# Patient Record
Sex: Male | Born: 1970 | Race: White | Hispanic: No | Marital: Married | Smoking: Current every day smoker
Health system: Southern US, Community
[De-identification: ages and names within clinical notes are randomized; demographics above are authoritative.]

## PROBLEM LIST (undated history)

## (undated) ENCOUNTER — Emergency Department: Admission: EM | Payer: Self-pay | Source: Home / Self Care

## (undated) DIAGNOSIS — N028 Recurrent and persistent hematuria with other morphologic changes: Secondary | ICD-10-CM

## (undated) HISTORY — PX: BACK SURGERY: SHX140

---
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
## 1998-11-19 DIAGNOSIS — N028 Recurrent and persistent hematuria with other morphologic changes: Secondary | ICD-10-CM

## 1998-11-19 HISTORY — DX: Recurrent and persistent hematuria with other morphologic changes: N02.8

## 2003-07-08 ENCOUNTER — Emergency Department (HOSPITAL_COMMUNITY): Admission: EM | Admit: 2003-07-08 | Discharge: 2003-07-08 | Payer: Self-pay | Admitting: Emergency Medicine

## 2013-02-06 ENCOUNTER — Other Ambulatory Visit: Payer: Self-pay | Admitting: Radiology

## 2019-03-16 ENCOUNTER — Emergency Department (HOSPITAL_COMMUNITY): Payer: Worker's Compensation

## 2019-03-16 ENCOUNTER — Emergency Department (HOSPITAL_COMMUNITY)
Admission: EM | Admit: 2019-03-16 | Discharge: 2019-03-16 | Disposition: A | Discharge status: Home / Self Care | Payer: Self-pay | Attending: Emergency Medicine | Admitting: Emergency Medicine

## 2019-03-16 ENCOUNTER — Encounter (HOSPITAL_COMMUNITY): Payer: Self-pay | Admitting: Emergency Medicine

## 2019-03-16 ENCOUNTER — Other Ambulatory Visit: Payer: Self-pay

## 2019-03-16 DIAGNOSIS — I10 Essential (primary) hypertension: Secondary | ICD-10-CM

## 2019-03-16 DIAGNOSIS — X503XXA Overexertion from repetitive movements, initial encounter: Secondary | ICD-10-CM | POA: Insufficient documentation

## 2019-03-16 DIAGNOSIS — Y990 Civilian activity done for income or pay: Secondary | ICD-10-CM | POA: Insufficient documentation

## 2019-03-16 DIAGNOSIS — F17210 Nicotine dependence, cigarettes, uncomplicated: Secondary | ICD-10-CM | POA: Insufficient documentation

## 2019-03-16 DIAGNOSIS — Y9263 Factory as the place of occurrence of the external cause: Secondary | ICD-10-CM | POA: Insufficient documentation

## 2019-03-16 DIAGNOSIS — S63502A Unspecified sprain of left wrist, initial encounter: Secondary | ICD-10-CM | POA: Insufficient documentation

## 2019-03-16 DIAGNOSIS — Y9389 Activity, other specified: Secondary | ICD-10-CM | POA: Insufficient documentation

## 2019-03-16 DIAGNOSIS — R030 Elevated blood-pressure reading, without diagnosis of hypertension: Secondary | ICD-10-CM | POA: Insufficient documentation

## 2019-03-16 NOTE — ED Notes (Signed)
Bed: WA03 Expected date:  Expected time:  Means of arrival:  Comments: 

## 2019-03-16 NOTE — ED Provider Notes (Signed)
[facility]   CSN: [id number] Arrival date & time: 03/16/19  1042    History   Chief Complaint Chief Complaint  Patient presents with  . Wrist Pain    left    HPI Tyrone Wood is a 48 y.o. 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  Onset on Friday.  Sudden.  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  He was using a box cutter to open up a box and had sudden, pulling pain in the ventral part of his left wrist.  A few minutes later he noticed some local bruising and swelling.  Since he has had pulling type pain in this area with any movement of the wrist and fingers.  No interventions for this.  Alleviated with rest.  He is right-hand dominant.  He denies associated distal paresthesias or loss of sensation.  No fevers, redness to the area, warmth.  His boss told him he should come to the ED to get a work note so she can put him on light duty.    Additionally, patient states he just got out of prison and is worried about his high blood pressure reading today in the ED and his kidney disease.  When he got out of prison he went to [facility] nephrology and they prescribed him lisinopril which he has been taking.  He smoked a few cigarettes en route to ED. He denies any severe headache, vision changes, chest pain, shortness of breath, back pain, lower extremity edema. Has not found a PCP since getting out of prison.    HPI  History reviewed. No pertinent past medical history.  There are no active problems to display for this patient.   History reviewed. No pertinent surgical history.      Home Medications    Prior to Admission medications   Not on File    Family History No family history on file.  Social History Social History   Tobacco Use  . Smoking status: Never Smoker  . Smokeless tobacco: Never Used  Substance Use Topics  . Alcohol use: Not on file  . Drug use: Not on file      Allergies   Patient has no known allergies.   Review of Systems Review of Systems  Musculoskeletal: Positive for arthralgias and joint swelling.  All other systems reviewed and are negative.    Physical Exam Updated Vital Signs BP (!) 144/66   Pulse 82   Temp 97.7 F (36.5 C) (Oral)   Resp 18   SpO2 100%   Physical Exam Constitutional:      Appearance: He is well-developed. He is not toxic-appearing.  HENT:     Head: Normocephalic.     Right Ear: External ear normal.     Left Ear: External ear normal.     Nose: Nose normal.  Eyes:     Conjunctiva/sclera: Conjunctivae normal.  Neck:     Musculoskeletal: Full passive range of motion without pain.  Cardiovascular:     Rate and Rhythm: Normal rate and regular rhythm.     Comments: Left upper extremity: 1+ radial pulse.  Brisk cap refill distally.  Fingers are warm. Pulmonary:     Effort: Pulmonary effort is normal. No tachypnea or respiratory distress.     Breath sounds: Normal breath sounds.  Musculoskeletal: Normal range of motion.        General: Swelling and tenderness present.  Comments: Left wrist: Mild focal tenderness and edema to the ventral wrist and distal forearm.  Full active range of motion of the wrist and fingers with some pain reported on the ventral wrist.  No crepitus.  No obvious deformity.  No focal tenderness to the scaphoid.  No overlying joint erythema, warmth, fluctuance.  Skin:    General: Skin is warm and dry.     Capillary Refill: Capillary refill takes less than 2 seconds.  Neurological:     Mental Status: He is alert and oriented to person, place, and time.     Comments: Sensation to light touch in the median, ulnar, radial nerve distribution intact in the left hand.  5/5 strength with handgrip and wrist pronation/supination, flexion and extension.  Psychiatric:        Behavior: Behavior normal.        Thought Content: Thought content normal.      ED Treatments / Results  Labs (all  labs ordered are listed, but only abnormal results are displayed) Labs Reviewed - No data to display  EKG None  Radiology Dg Wrist Complete Left  Result Date: 03/16/2019 CLINICAL DATA:  Injury to the LEFT wrist while cutting boxes at work 3 days ago, now with bruising and swelling. Audible pop at the time of the injury. Initial encounter. EXAM: LEFT WRIST - COMPLETE 3+ VIEW COMPARISON:  None. FINDINGS: Mild soft tissue swelling and moderate-sized joint effusion. No evidence of acute fracture; accessory ossicles for the ulnar styloid, the radial styloid and an os radiale externum (adjacent to the distal scaphoid) are present. Well-preserved joint spaces. Well-preserved bone mineral density. IMPRESSION: No acute osseous abnormality. Accessory ossicles for the ulnar styloid, the radial styloid and the os radiale externum are present. Electronically Signed   By: Hulan Saashomas  Lawrence M.D.   On: 03/16/2019 13:07    Procedures Procedures (including critical care time)  Medications Ordered in ED Medications - No data to display   Initial Impression / Assessment and Plan / ED Course  I have reviewed the triage vital signs and the nursing notes.  Pertinent labs & imaging results that were available during my care of the patient were reviewed by me and considered in my medical decision making (see chart for details).       Moderate wrist effusion on exam but no acute unstable fractures.  Extremity is NVI. Joint has almost full ROM limited by pain.  No signs of overlaying infection, septic arthritis. Doubt gout in this setting. Suspect soft tissue injury strain vs carpal tunnel syndrome.  Wrist placed in velcro splint. Dc with 1g tylenol q8, RICE for 1 week. F/u with PCP for re-evaluation. Work note given to modify work activities.   SBP normal, DBP elevated in setting of known HTN.  Smoked cigarettes PTA which could be contributing.  No clinical features to suggest end organ dysfunction such as HA,  vision changes, CP, SOB, orthopnea, LE edema.  Asymptomatic hypertension, no further indication for aggressive BP control or further emergent work up.  Will dc with f/u with PCP within one week for recheck of BP, may need med adjustment. Return precautions discussed. He is in agreement with this.  Final Clinical Impressions(s) / ED Diagnoses   Final diagnoses:  Sprain of left wrist, initial encounter  Elevated blood pressure reading in office with diagnosis of hypertension    ED Discharge Orders    None       Liberty HandyGibbons, Lourene Hoston J, PA-C 03/16/19 2252    [place],  Riley Lam, MD 03/19/19 [phone]

## 2019-03-16 NOTE — ED Triage Notes (Signed)
Pt reports on Friday he was at work cutting boxes when something popped in left wrist. Had bruising and swelling. No bruising noted now. Was instructed to boss to go to ED to be sen and get note for work restrictions since he is having pains.

## 2019-03-16 NOTE — Discharge Instructions (Addendum)
You were seen in the ED for wrist pain. X-ray showed an effusion (fluid). This may be from soft tissue injury or tendon/ligament injury.  Wear brace for 10 days.  Modify activities at work.  Take 1000 mg acetaminophen (tylenol) every 8 hours for the next 7 days. Rest. Ice. Elevate.  Return for more swelling, redness, warmth, fever, loss of sensation of finger tips  Follow up with primary care doctor if pain persists. Follow up with primary care doctor about elevated blood pressure. Continue taking your blood pressure medicines. Return for severe headaches, vision changes, chest pain, shortness of breath, lower extremity swelling, stroke symptoms

## 2019-04-30 ENCOUNTER — Emergency Department (HOSPITAL_COMMUNITY)
Admission: EM | Admit: 2019-04-30 | Discharge: 2019-05-01 | Disposition: A | Discharge status: Home / Self Care | Payer: Self-pay | Attending: Emergency Medicine | Admitting: Emergency Medicine

## 2019-04-30 ENCOUNTER — Encounter (HOSPITAL_COMMUNITY): Payer: Self-pay | Admitting: Emergency Medicine

## 2019-04-30 ENCOUNTER — Other Ambulatory Visit: Payer: Self-pay

## 2019-04-30 DIAGNOSIS — F1920 Other psychoactive substance dependence, uncomplicated: Secondary | ICD-10-CM | POA: Insufficient documentation

## 2019-04-30 DIAGNOSIS — R456 Violent behavior: Secondary | ICD-10-CM | POA: Insufficient documentation

## 2019-04-30 NOTE — ED Notes (Signed)
Bed: FV43 Expected date:  Expected time:  Means of arrival:  Comments: EMS 48 yo male wants rehab for heroin use 120/70 SR HR 90
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
## 2019-05-01 NOTE — ED Notes (Signed)
Patient ambulating around room, talking on the phone. Patient given PO fluids and a sandwich.

## 2019-05-01 NOTE — ED Provider Notes (Signed)
[facility] Provider Note   CSN: [id number] Arrival date & time: 04/30/19  2317    History   Chief Complaint Chief Complaint  Patient presents with  . heroin use    HPI Christia ReadingJimmy Sane is a 48 y.o. male.     Patient brought to the emergency department by EMS.  He apparently snorted heroin earlier tonight and drank alcohol.  EMS reports that he called them stating that he needed detox from heroin.  EMS report that he had been sleepy but arousable throughout the transport but upon putting him into the room in the ER he became agitated and combative. Level V Caveat due to mental status change.     History reviewed. No pertinent past medical history.  There are no active problems to display for this patient.   History reviewed. No pertinent surgical history.      Home Medications    Prior to Admission medications   Not on File    Family History No family history on file.  Social History Social History   Tobacco Use  . Smoking status: Never Smoker  . Smokeless tobacco: Never Used  Substance Use Topics  . Alcohol use: Not on file  . Drug use: Not on file     Allergies   Patient has no known allergies.   Review of Systems Review of Systems  Unable to perform ROS: Mental status change     Physical Exam Updated Vital Signs BP 120/70   Pulse 90   Temp 98.2 F (36.8 C) (Oral)   Resp 14   SpO2 95%   Physical Exam Vitals signs and nursing note reviewed.  Constitutional:      General: He is not in acute distress.    Appearance: Normal appearance. He is well-developed.  HENT:     Head: Normocephalic and atraumatic.     Right Ear: Hearing normal.     Left Ear: Hearing normal.     Nose: Nose normal.  Eyes:     Conjunctiva/sclera: Conjunctivae normal.     Pupils: Pupils are equal, round, and reactive to light.  Neck:     Musculoskeletal: Normal range of motion and neck supple.  Cardiovascular:     Rate and  Rhythm: Regular rhythm.     Heart sounds: S1 normal and S2 normal. No murmur. No friction rub. No gallop.   Pulmonary:     Effort: Pulmonary effort is normal. No respiratory distress.     Breath sounds: Normal breath sounds.  Chest:     Chest wall: No tenderness.  Abdominal:     General: Bowel sounds are normal.     Palpations: Abdomen is soft.     Tenderness: There is no abdominal tenderness. There is no guarding or rebound. Negative signs include Murphy's sign and McBurney's sign.     Hernia: No hernia is present.  Musculoskeletal: Normal range of motion.  Skin:    General: Skin is warm and dry.     Findings: No rash.  Neurological:     GCS: GCS eye subscore is 4. GCS verbal subscore is 4. GCS motor subscore is 6.     Cranial Nerves: No cranial nerve deficit.     Sensory: No sensory deficit.     Coordination: Coordination normal.     Comments: Somnolent but easily arousable  Psychiatric:        Speech: Speech is slurred.        Behavior: Behavior is agitated.  ED Treatments / Results  Labs (all labs ordered are listed, but only abnormal results are displayed) Labs Reviewed - No data to display  EKG None  Radiology No results found.  Procedures Procedures (including critical care time)  Medications Ordered in ED Medications - No data to display   Initial Impression / Assessment and Plan / ED Course  I have reviewed the triage vital signs and the nursing notes.  Pertinent labs & imaging results that were available during my care of the patient were reviewed by me and considered in my medical decision making (see chart for details).        Patient brought to the emergency department with altered mental status after drinking alcohol and using heroin.  Patient was monitored here in the ER for a period of time he became more awake and alert.  At some point he eloped from the ER, attempts to find him on the grounds were unsuccessful.  Final Clinical  Impressions(s) / ED Diagnoses   Final diagnoses:  Polysubstance dependence including opioid type drug without complication, episodic abuse [facility])    ED Discharge Orders    None       Orpah Greek, MD 05/02/19 [phone]

## 2019-05-01 NOTE — ED Notes (Signed)
Patient wanted to leave after speaking with his sister on the phone. Patient started walking out of the facility. He was attempting to smoke in his room and was told he was not allowed to, he had to leave the property. Security escorted patient off the property.

## 2019-05-01 NOTE — Progress Notes (Signed)
CSW received phone call from patient who was located in the ED lobby. Patient reports he needs assistance with getting to [facility] in [place] (patient was unsure of the facility initially, so CSW had to call and confirm). Patient reports he was told last night that detox is not done here and he needs help as he OD on heroin and drank alcohol last night. Of note, patient left hospital AMA after wanting to smoke a cigarette and attempting to smoke in his room leading him to be escorted out of the hospital by security. CSW consulted with [facility] assistance director Nathaniel Man as well as [facility] Nurse for assistance on providing transportation for patient. It was determined that because patient left AMA, CSW will not be able to provide patient with a taxi voucher due to him not being medically cleared. CSW explained this to patient with a Animal nutritionist present. Patient reports that he will try to find his own transportation.   [place]en Circle, LCSW Transitions of Care Department [facility] ED [phone]

## 2019-05-06 ENCOUNTER — Other Ambulatory Visit: Payer: Self-pay

## 2019-05-06 ENCOUNTER — Emergency Department (HOSPITAL_COMMUNITY)
Admission: EM | Admit: 2019-05-06 | Discharge: 2019-05-06 | Disposition: A | Discharge status: Home / Self Care | Payer: Self-pay | Attending: Emergency Medicine | Admitting: Emergency Medicine

## 2019-05-06 ENCOUNTER — Encounter (HOSPITAL_COMMUNITY): Payer: Self-pay | Admitting: Emergency Medicine

## 2019-05-06 ENCOUNTER — Emergency Department (HOSPITAL_COMMUNITY): Payer: Self-pay

## 2019-05-06 DIAGNOSIS — Y939 Activity, unspecified: Secondary | ICD-10-CM | POA: Insufficient documentation

## 2019-05-06 DIAGNOSIS — Y999 Unspecified external cause status: Secondary | ICD-10-CM | POA: Insufficient documentation

## 2019-05-06 DIAGNOSIS — Y929 Unspecified place or not applicable: Secondary | ICD-10-CM | POA: Insufficient documentation

## 2019-05-06 DIAGNOSIS — F17210 Nicotine dependence, cigarettes, uncomplicated: Secondary | ICD-10-CM | POA: Insufficient documentation

## 2019-05-06 DIAGNOSIS — R42 Dizziness and giddiness: Secondary | ICD-10-CM | POA: Insufficient documentation

## 2019-05-06 DIAGNOSIS — F1990 Other psychoactive substance use, unspecified, uncomplicated: Secondary | ICD-10-CM | POA: Insufficient documentation

## 2019-05-06 DIAGNOSIS — S0083XA Contusion of other part of head, initial encounter: Secondary | ICD-10-CM

## 2019-05-06 DIAGNOSIS — S0003XA Contusion of scalp, initial encounter: Secondary | ICD-10-CM | POA: Insufficient documentation

## 2019-05-06 MED ORDER — ACETAMINOPHEN 500 MG PO TABS
1000.0000 mg | ORAL_TABLET | Freq: Once | ORAL | Status: DC
  Filled 2019-05-06: qty 2
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
## 2019-05-06 NOTE — ED Notes (Signed)
Pt discharged from ED; instructions provided  given; Pt encouraged to return to ED if symptoms worsen and to f/u with PCP; Pt verbalized understanding of all instructions 

## 2019-05-06 NOTE — Discharge Instructions (Signed)
Discontinue use of illicit drugs.  We recommend Tylenol or ibuprofen for management of any residual discomfort.  Apply ice to areas of pain 3-4 times per day to limit inflammation/swelling.  Follow-up with a primary care doctor to ensure that symptoms resolve.  You may return to the ED for new or concerning symptoms.

## 2019-05-29 ENCOUNTER — Ambulatory Visit: Payer: Self-pay | Admitting: Internal Medicine

## 2019-06-12 ENCOUNTER — Ambulatory Visit: Payer: Self-pay | Admitting: Family Medicine
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
## 2020-09-29 ENCOUNTER — Emergency Department
Admission: EM | Admit: 2020-09-29 | Discharge: 2020-09-30 | Disposition: A | Discharge status: Home / Self Care | Payer: Self-pay | Attending: Emergency Medicine | Admitting: Emergency Medicine

## 2020-09-29 ENCOUNTER — Emergency Department: Payer: Self-pay

## 2020-09-29 ENCOUNTER — Other Ambulatory Visit: Payer: Self-pay

## 2020-09-29 DIAGNOSIS — I10 Essential (primary) hypertension: Secondary | ICD-10-CM

## 2020-09-29 DIAGNOSIS — F329 Major depressive disorder, single episode, unspecified: Secondary | ICD-10-CM | POA: Insufficient documentation

## 2020-09-29 DIAGNOSIS — F1099 Alcohol use, unspecified with unspecified alcohol-induced disorder: Secondary | ICD-10-CM | POA: Insufficient documentation

## 2020-09-29 DIAGNOSIS — F32A Depression, unspecified: Secondary | ICD-10-CM

## 2020-09-29 DIAGNOSIS — F322 Major depressive disorder, single episode, severe without psychotic features: Secondary | ICD-10-CM

## 2020-09-29 DIAGNOSIS — F1010 Alcohol abuse, uncomplicated: Secondary | ICD-10-CM

## 2020-09-29 DIAGNOSIS — F17200 Nicotine dependence, unspecified, uncomplicated: Secondary | ICD-10-CM | POA: Insufficient documentation

## 2020-09-29 DIAGNOSIS — Z20822 Contact with and (suspected) exposure to covid-19: Secondary | ICD-10-CM | POA: Insufficient documentation

## 2020-09-29 DIAGNOSIS — Z789 Other specified health status: Secondary | ICD-10-CM

## 2020-09-29 DIAGNOSIS — Z7289 Other problems related to lifestyle: Secondary | ICD-10-CM

## 2020-09-29 LAB — SALICYLATE LEVEL: Salicylate Lvl: <7 mg/dL — ABNORMAL LOW (ref 7.0–30.0)

## 2020-09-29 LAB — COMPREHENSIVE METABOLIC PANEL
ALT: 30 U/L (ref 0–44)
AST: 32 U/L (ref 15–41)
Albumin: 4.3 g/dL (ref 3.5–5.0)
Alkaline Phosphatase: 71 U/L (ref 38–126)
Anion gap: 13 (ref 5–15)
BUN: 26 mg/dL — ABNORMAL HIGH (ref 6–20)
CO2: 25 mmol/L (ref 22–32)
Calcium: 8.9 mg/dL (ref 8.9–10.3)
Chloride: 99 mmol/L (ref 98–111)
Creatinine, Ser: 1.46 mg/dL — ABNORMAL HIGH (ref 0.61–1.24)
GFR, Estimated: 59 mL/min — ABNORMAL LOW (ref >60)
Glucose, Bld: 124 mg/dL — ABNORMAL HIGH (ref 70–99)
Potassium: 4.4 mmol/L (ref 3.5–5.1)
Sodium: 137 mmol/L (ref 135–145)
Total Bilirubin: 0.8 mg/dL (ref 0.3–1.2)
Total Protein: 7.6 g/dL (ref 6.5–8.1)

## 2020-09-29 LAB — URINE DRUG SCREEN, QUALITATIVE (ARMC ONLY)
Amphetamines, Ur Screen: NOT DETECTED
Barbiturates, Ur Screen: NOT DETECTED
Benzodiazepine, Ur Scrn: NOT DETECTED
Cannabinoid 50 Ng, Ur ~~LOC~~: NOT DETECTED
Cocaine Metabolite,Ur ~~LOC~~: NOT DETECTED
MDMA (Ecstasy)Ur Screen: NOT DETECTED
Methadone Scn, Ur: NOT DETECTED
Opiate, Ur Screen: NOT DETECTED
Phencyclidine (PCP) Ur S: NOT DETECTED
Tricyclic, Ur Screen: NOT DETECTED

## 2020-09-29 LAB — CBC
HCT: 48 % (ref 39.0–52.0)
Hemoglobin: 15.8 g/dL (ref 13.0–17.0)
MCH: 29.3 pg (ref 26.0–34.0)
MCHC: 32.9 g/dL (ref 30.0–36.0)
MCV: 88.9 fL (ref 80.0–100.0)
Platelets: 304 10*3/uL (ref 150–400)
RBC: 5.4 MIL/uL (ref 4.22–5.81)
RDW: 14.2 % (ref 11.5–15.5)
WBC: 10.9 10*3/uL — ABNORMAL HIGH (ref 4.0–10.5)
nRBC: 0 % (ref 0.0–0.2)

## 2020-09-29 LAB — RESPIRATORY PANEL BY RT PCR (FLU A&B, COVID)
Influenza A by PCR: NEGATIVE
Influenza B by PCR: NEGATIVE
SARS Coronavirus 2 by RT PCR: NEGATIVE

## 2020-09-29 LAB — ETHANOL: Alcohol, Ethyl (B): <10 mg/dL (ref <10)

## 2020-09-29 LAB — ACETAMINOPHEN LEVEL: Acetaminophen (Tylenol), Serum: <10 ug/mL — ABNORMAL LOW (ref 10–30)

## 2020-09-29 MED ORDER — LISINOPRIL 5 MG PO TABS
10.0000 mg | ORAL_TABLET | Freq: Every day | ORAL | Status: DC
  Administered 2020-09-30: 10 mg via ORAL
  Filled 2020-09-29: qty 2

## 2020-09-29 MED ORDER — LORAZEPAM 1 MG PO TABS
1.0000 mg | ORAL_TABLET | Freq: Once | ORAL | Status: AC
  Administered 2020-09-29: 1 mg via ORAL
  Filled 2020-09-29: qty 1

## 2020-09-29 MED ORDER — QUETIAPINE FUMARATE 100 MG PO TABS
100.0000 mg | ORAL_TABLET | Freq: Every day | ORAL | Status: DC
  Administered 2020-09-29: 100 mg via ORAL
  Filled 2020-09-29: qty 1
  Filled 2020-09-29: qty 4
  Filled 2020-09-29: qty 1

## 2020-09-29 NOTE — ED Notes (Signed)
Pt dressed out by this RN and [place] ED tech.  Pt belongings include: Blue jeans  Black belt Green and black shirt Black white and blue Nike shoes. Brown wallet, black mask, white mask, loose change, lighter placed into clear bag to put in belongings bag. White underwear White socks  Pt also has multiple other bags that pt labels were placed onto

## 2020-09-29 NOTE — ED Notes (Signed)
Psych at bedside.

## 2020-09-29 NOTE — ED Notes (Signed)
Dr. Paduchowski at bedside.  

## 2020-09-29 NOTE — ED Notes (Signed)
Dinner tray given

## 2020-09-29 NOTE — ED Notes (Signed)
Hourly rounding reveals patient in room. No complaints, stable, in no acute distress. Q15 minute rounds and monitoring via [employer] to continue. 

## 2020-09-29 NOTE — ED Notes (Signed)
Pt requesting his wallet to get important phone numbers

## 2020-09-29 NOTE — Consult Note (Signed)
[facility]   Reason for Consult: Consult for this 49 year old man who came voluntarily to the emergency room with complaints of suicidal ideation and depression Referring Physician: Paduchowski Patient Identification: Tyrone ReadingJimmy Hickle MRN:  [id number] Principal Diagnosis: Severe major depression, single episode, without psychotic features (HCC) Diagnosis:  Principal Problem:   Severe major depression, single episode, without psychotic features (HCC) Active Problems:   Alcohol abuse   Hypertension   Total Time spent with patient: 1 hour  Subjective:   Tyrone Wood is a 49 y.o. male patient admitted with "I just feel like I need to get my mind together".  HPI: Patient seen chart reviewed.  49 year old man came to the emergency room voluntarily evidently after first trying to go to residential treatment services.  Patient reports that for the last few days he has been feeling extremely sad depressed and hopeless.  Sleeping poorly at night.  Feeling of general negative thoughts about himself.  Suicidal thoughts.  Today he reports that he was sitting on an overpass thinking of throwing himself off and killing himself but decided to come get help instead.  [facility] but then relapsed into alcohol use using what he describes as "a couple beers" yesterday.  As a result they have banned him for at least the next 4 to 5 days.  Patient denies any current auditory or visual hallucinations.  Denies any physical symptoms.  Not shaky no sign of delirium.  He said that he had been taking Seroquel for the last week which had been prescribed to him in [facility].  He says he has no family or anyone else around who can be of assistance to him at this time.  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  Evidently he has had long periods of incarceration as well.  Recent  hospitalization in [facility] for depression accompanied by substance abuse.  At that time reportedly had been planning to shoot himself.  Risk to Self:   Risk to Others:   Prior Inpatient Therapy:   Prior Outpatient Therapy:    Past Medical History:  Past Medical History:  Diagnosis Date  . IgA nephropathy   . IgA nephropathy   . IgA nephropathy 2000    Past Surgical History:  Procedure Laterality Date  . BACK SURGERY     Family History: History reviewed. No pertinent family history. Family Psychiatric  History: None reported Social History:  Social History   Substance and Sexual Activity  Alcohol Use Yes     Social History   Substance and Sexual Activity  Drug Use Not Currently  . Types: Cocaine, Methamphetamines   Comment: Heroine, crack    Social History   Socioeconomic History  . Marital status: Married    Spouse name: Not on file  . Number of children: Not on file  . Years of education: Not on file  . Highest education level: Not on file  Occupational History  . Not on file  Tobacco Use  . Smoking status: Current Every Day Smoker  . Smokeless tobacco: Never Used  Vaping Use  . Vaping Use: Never used  Substance and Sexual Activity  . Alcohol use: Yes  . Drug use: Not Currently    Types: Cocaine, Methamphetamines    Comment: Heroine, crack  . Sexual activity: Not on file  Other Topics Concern  . Not on file  Social  History Narrative  . Not on file   Social Determinants of Health   Financial Resource Strain:   . Difficulty of Paying Living Expenses: Not on file  Food Insecurity:   . Worried About Programme researcher, broadcasting/film/video in the Last Year: Not on file  . Ran Out of Food in the Last Year: Not on file  Transportation Needs:   . Lack of Transportation (Medical): Not on file  . Lack of Transportation (Non-Medical): Not on file  Physical Activity:   . Days of Exercise per Week: Not on file  . Minutes of Exercise per Session: Not on file  Stress:   .  Feeling of Stress : Not on file  Social Connections:   . Frequency of Communication with Friends and Family: Not on file  . Frequency of Social Gatherings with Friends and Family: Not on file  . Attends Religious Services: Not on file  . Active Member of Clubs or Organizations: Not on file  . Attends Banker Meetings: Not on file  . Marital Status: Not on file   Additional Social History:    Allergies:  No Known Allergies  Labs:  Results for orders placed or performed during the hospital encounter of 09/29/20 (from the past 48 hour(s))  Comprehensive metabolic panel     Status: Abnormal   Collection Time: 09/29/20  1:25 PM  Result Value Ref Range   Sodium 137 135 - 145 mmol/L   Potassium 4.4 3.5 - 5.1 mmol/L   Chloride 99 98 - 111 mmol/L   CO2 25 22 - 32 mmol/L   Glucose, Bld 124 (H) 70 - 99 mg/dL    Comment: Glucose reference range applies only to samples taken after fasting for at least 8 hours.   BUN 26 (H) 6 - 20 mg/dL   Creatinine, Ser 7.12 (H) 0.61 - 1.24 mg/dL   Calcium 8.9 8.9 - 45.8 mg/dL   Total Protein 7.6 6.5 - 8.1 g/dL   Albumin 4.3 3.5 - 5.0 g/dL   AST 32 15 - 41 U/L   ALT 30 0 - 44 U/L   Alkaline Phosphatase 71 38 - 126 U/L   Total Bilirubin 0.8 0.3 - 1.2 mg/dL   GFR, Estimated 59 (L) >60 mL/min    Comment: (NOTE) Calculated using the CKD-EPI Creatinine Equation (2021)    Anion gap 13 5 - 15    Comment: Performed at [facility], [address] [place], [place] [address]  Ethanol     Status: None   Collection Time: 09/29/20  1:25 PM  Result Value Ref Range   Alcohol, Ethyl (B) <10 <10 mg/dL    Comment: (NOTE) Lowest detectable limit for serum alcohol is 10 mg/dL.  For medical purposes only. Performed at [facility], [address] [place], [place] [address]   Salicylate level     Status: Abnormal   Collection Time: 09/29/20  1:25 PM  Result Value Ref Range   Salicylate Lvl <7.0 (L) 7.0 - 30.0 mg/dL    Comment:  Performed at [facility], [address] [place], [place] [address]  Acetaminophen level     Status: Abnormal   Collection Time: 09/29/20  1:25 PM  Result Value Ref Range   Acetaminophen (Tylenol), Serum <10 (L) 10 - 30 ug/mL    Comment: (NOTE) Therapeutic concentrations vary significantly. A range of 10-30 ug/mL  may be an effective concentration for many patients. However, some  are best treated at concentrations outside  of this range. Acetaminophen concentrations >150 ug/mL at 4 hours after ingestion  and >50 ug/mL at 12 hours after ingestion are often associated with  toxic reactions.  Performed at [facility], [address] [place], [place] [address]   cbc     Status: Abnormal   Collection Time: 09/29/20  1:25 PM  Result Value Ref Range   WBC 10.9 (H) 4.0 - 10.5 K/uL   RBC 5.40 4.22 - 5.81 MIL/uL   Hemoglobin 15.8 13.0 - 17.0 g/dL   HCT 19.1 39 - 52 %   MCV 88.9 80.0 - 100.0 fL   MCH 29.3 26.0 - 34.0 pg   MCHC 32.9 30.0 - 36.0 g/dL   RDW 47.8 29.5 - 62.1 %   Platelets 304 150 - 400 K/uL   nRBC 0.0 0.0 - 0.2 %    Comment: Performed at [facility], [address], [place], [place] [address]  Urine Drug Screen, Qualitative     Status: None   Collection Time: 09/29/20  1:25 PM  Result Value Ref Range   Tricyclic, Ur Screen NONE DETECTED NONE DETECTED   Amphetamines, Ur Screen NONE DETECTED NONE DETECTED   MDMA (Ecstasy)Ur Screen NONE DETECTED NONE DETECTED   Cocaine Metabolite,Ur [place] NONE DETECTED NONE DETECTED   Opiate, Ur Screen NONE DETECTED NONE DETECTED   Phencyclidine (PCP) Ur S NONE DETECTED NONE DETECTED   Cannabinoid 50 Ng, Ur [place] NONE DETECTED NONE DETECTED   Barbiturates, Ur Screen NONE DETECTED NONE DETECTED   Benzodiazepine, Ur Scrn NONE DETECTED NONE DETECTED   Methadone Scn, Ur NONE DETECTED NONE DETECTED    Comment: (NOTE) Tricyclics + metabolites, urine    Cutoff 1000 ng/mL Amphetamines + metabolites, urine  Cutoff 1000  ng/mL MDMA (Ecstasy), urine              Cutoff 500 ng/mL Cocaine Metabolite, urine          Cutoff 300 ng/mL Opiate + metabolites, urine        Cutoff 300 ng/mL Phencyclidine (PCP), urine         Cutoff 25 ng/mL Cannabinoid, urine                 Cutoff 50 ng/mL Barbiturates + metabolites, urine  Cutoff 200 ng/mL Benzodiazepine, urine              Cutoff 200 ng/mL Methadone, urine                   Cutoff 300 ng/mL  The urine drug screen provides only a preliminary, unconfirmed analytical test result and should not be used for non-medical purposes. Clinical consideration and professional judgment should be applied to any positive drug screen result due to possible interfering substances. A more specific alternate chemical method must be used in order to obtain a confirmed analytical result. Gas chromatography / mass spectrometry (GC/MS) is the preferred confirm atory method. Performed at [facility], [address] [place], [place] [address]     No current facility-administered medications for this encounter.   No current outpatient medications on file.    Musculoskeletal: Strength & Muscle Tone: within normal limits Gait & Station: normal Patient leans: N/A  Psychiatric Specialty Exam: Physical Exam Vitals and nursing note reviewed.  Constitutional:      Appearance: He is well-developed.  HENT:     Head: Normocephalic and atraumatic.  Eyes:     Conjunctiva/sclera: Conjunctivae normal.     Pupils: Pupils are equal,  round, and reactive to light.  Cardiovascular:     Heart sounds: Normal heart sounds.  Pulmonary:     Effort: Pulmonary effort is normal.  Abdominal:     Palpations: Abdomen is soft.  Musculoskeletal:        General: Normal range of motion.     Cervical back: Normal range of motion.  Skin:    General: Skin is warm and dry.  Neurological:     General: No focal deficit present.     Mental Status: He is alert.  Psychiatric:         Attention and Perception: Attention normal.        Mood and Affect: Mood is anxious and depressed.        Speech: Speech normal.        Behavior: Behavior is agitated. Behavior is not aggressive.        Thought Content: Thought content is not paranoid. Thought content includes suicidal ideation. Thought content does not include homicidal ideation. Thought content includes suicidal plan.        Cognition and Memory: Cognition normal.        Judgment: Judgment is impulsive.     Review of Systems  Constitutional: Negative.   HENT: Negative.   Eyes: Negative.   Respiratory: Negative.   Cardiovascular: Negative.   Gastrointestinal: Negative.   Musculoskeletal: Negative.   Skin: Negative.   Neurological: Negative.   Psychiatric/Behavioral: Positive for dysphoric mood and suicidal ideas.    Blood pressure (!) 151/102, pulse (!) 112, temperature 97.9 F (36.6 C), temperature source Oral, resp. rate 20, height 6' (1.829 m), weight 99.8 kg, SpO2 100 %.Body mass index is 29.84 kg/m.  General Appearance: Disheveled  Eye Contact:  Good  Speech:  Clear and Coherent  Volume:  Normal  Mood:  Anxious and Dysphoric  Affect:  Depressed  Thought Process:  Coherent  Orientation:  Full (Time, Place, and Person)  Thought Content:  Logical  Suicidal Thoughts:  Yes.  with intent/plan  Homicidal Thoughts:  No  Memory:  Immediate;   Fair Recent;   Poor Remote;   Fair  Judgement:  Impaired  Insight:  Shallow  Psychomotor Activity:  Restlessness  Concentration:  Concentration: Poor  Recall:  [employer] of Knowledge:  Fair  Language:  Fair  Akathisia:  No  Handed:  Right  AIMS (if indicated):     Assets:  Desire for Improvement Physical Health Resilience  ADL's:  Intact  Cognition:  WNL  Sleep:        Treatment Plan Summary: Daily contact with patient to assess and evaluate symptoms and progress in treatment, Medication management and Plan Patient with a history of substance abuse and  mood problems presents to [place] reporting suicidal ideation with thoughts of jumping off a bridge.  Major and overwhelming life stresses present.  No outpatient treatment in place.  No place to stay.  Patient appears very nervous and pleading but is cooperative with treatment.  We will make plans to try and get him voluntarily admitted.  We are very limited in bed capacity here and I have asked TTS to look into referral to other facilities.  Continue nighttime Seroquel and lisinopril for now.  Patient does not appear to require specific detox if it is true that he just had a small amount to drink last night.  His drug screen and alcohol level is currently negative.  Case reviewed with emergency room doctor and TTS  Disposition: Recommend psychiatric Inpatient  admission when medically cleared.  Mordecai Rasmussen, MD 09/29/2020 3:55 PM

## 2020-09-29 NOTE — ED Notes (Signed)
Pt. Transferred to BHU from ED to room 4 after screening for contraband. Report to include Situation, Background, Assessment and Recommendations from Jenna RN. Pt. Oriented to unit including Q15 minute rounds as well as the security cameras for their protection. Patient is alert and oriented, warm and dry in no acute distress. Patient denies SI, HI, and AVH. Pt. Encouraged to let me know if needs arise. 

## 2020-09-29 NOTE — ED Provider Notes (Signed)
[facility] Emergency Department Provider Note  Time seen: 3:36 PM  I have reviewed the triage vital signs and the nursing notes.   HISTORY  Chief Complaint Psychiatric Evaluation   HPI Tyrone Wood is a 49 y.o. male with a past medical history of alcohol use, depression, presents to the emergency department for psychiatric evaluation.  According to the patient he has been feeling more depressed recently.  States yesterday he got very drunk drinking alcohol, is currently staying at a [employer] and got kicked out of [employer] due to using alcohol.  States he is allowed to return in 4 days, states he came here because he has nowhere else to go home was hoping to get help with his mental health.  Patient has a history of suicidal ideation in the past.  States he is actively having thoughts of hurting himself.   Is asking if he can stay here through the weekend and return back to his [employer] on Monday.  Denies any medical complaints.  Past Medical History:  Diagnosis Date  . IgA nephropathy   . IgA nephropathy   . IgA nephropathy 2000    There are no problems to display for this patient.   Past Surgical History:  Procedure Laterality Date  . BACK SURGERY      Prior to Admission medications   Not on File    No Known Allergies  History reviewed. No pertinent family history.  Social History Social History   Tobacco Use  . Smoking status: Current Every Day Smoker  . Smokeless tobacco: Never Used  Vaping Use  . Vaping Use: Never used  Substance Use Topics  . Alcohol use: Yes  . Drug use: Not Currently    Types: Cocaine, Methamphetamines    Comment: Heroine, crack    Review of Systems Constitutional: Negative for fever. Cardiovascular: Negative for chest pain. Respiratory: Negative for shortness of breath. Gastrointestinal: Negative for abdominal pain, vomiting and diarrhea. Genitourinary: Negative for urinary  compaints Musculoskeletal: Negative for musculoskeletal complaints Neurological: Negative for headache All other ROS negative  ____________________________________________   PHYSICAL EXAM:  VITAL SIGNS: ED Triage Vitals  Enc Vitals Group     BP 09/29/20 1315 (!) 151/102     Pulse Rate 09/29/20 1315 (!) 112     Resp 09/29/20 1315 20     Temp 09/29/20 1315 97.9 F (36.6 C)     Temp Source 09/29/20 1315 Oral     SpO2 09/29/20 1315 100 %     Weight 09/29/20 1322 220 lb (99.8 kg)     Height 09/29/20 1322 6' (1.829 m)     Head Circumference --      Peak Flow --      Pain Score 09/29/20 1322 0     Pain Loc --      Pain Edu? --      Excl. in GC? --    Constitutional: Alert and oriented. Well appearing and in no distress. Eyes: Normal exam ENT      Head: Normocephalic and atraumatic.      Mouth/Throat: Mucous membranes are moist. Cardiovascular: Normal rate, regular rhythm.  Respiratory: Normal respiratory effort without tachypnea nor retractions. Breath sounds are clear  Gastrointestinal: Soft and nontender. No distention.   Musculoskeletal: Nontender with normal range of motion in all extremities.  Neurologic:  Normal speech and language. No gross focal neurologic deficits Skin:  Skin is warm, dry and intact.  Psychiatric: Flat affect.  ____________________________________________  INITIAL IMPRESSION / ASSESSMENT AND PLAN / ED COURSE  Pertinent labs & imaging results that were available during my care of the patient were reviewed by me and considered in my medical decision making (see chart for details).   Patient presents emergency department for worsening depression, recent alcohol use in current homeless status.  Patient does seem depressed with flat affect.  We'll have psychiatry evaluate.  Patient's medical work-up is largely nonrevealing.  Normal labs negative urine drug screen.  Negative alcohol.  Patient is medically cleared awaiting psychiatric  evaluation.  Patient has been seen by psychiatry.  They will likely look for placement for inpatient treatment for the patient.  Tyrone Wood was evaluated in Emergency Department on 09/29/2020 for the symptoms described in the history of present illness. He was evaluated in the context of the global COVID-19 pandemic, which necessitated consideration that the patient might be at risk for infection with the SARS-CoV-2 virus that causes COVID-19. Institutional protocols and algorithms that pertain to the evaluation of patients at risk for COVID-19 are in a state of rapid change based on information released by regulatory bodies including the CDC and federal and state organizations. These policies and algorithms were followed during the patient's care in the ED.  The patient has been placed in psychiatric observation due to the need to provide a safe environment for the patient while obtaining psychiatric consultation and evaluation, as well as ongoing medical and medication management to treat the patient's condition.  The patient has not been placed under full IVC at this time.   ____________________________________________   FINAL CLINICAL IMPRESSION(S) / ED DIAGNOSES  Depression Alcohol use   Minna Antis, MD 09/29/20 [phone]

## 2020-09-29 NOTE — BH Assessment (Addendum)
Referral information for Psychiatric Hospitalization faxed to; No appropriate beds at [facility].   [facility] [phone]),    [facility] [phone]),   [place] [phone], [phone], [phone] or [phone]),    [facility] [phone] or [phone])   [place] [phone]),    [facility] [phone] -[phone]), No current beds but advised to fax information. Beds my become available tomorrow (09/30/2020).   [facility] [phone]).

## 2020-09-29 NOTE — BH Assessment (Signed)
Assessment Note  Tyrone Wood is an 49 y.o. male who presents to the ER due to having thoughts of ending his life. Patient states, this morning (09/29/2020) he was sitting on the side of a bridge thinking about jumping off it but a friend prevented him from doing it. Patient further explained he was recently living at the [facility] but because he relapsed on alcohol, he had to leave for several days but he is able to return. While no longer living there, his symptoms of depression increased and he's unable to manage it. Patient also shared he has limited support system and resources to help him cope with the depression. He reports of having thoughts of hopelessness, helplessness and worthlessness.   During the interview, the patient was calm, cooperative and pleasant. He was able to provide appropriate answers to the questions. Throughout the interview, he denied HI and AV/H. He also denies history of violence and aggression. He reports of having no involvement with the legal system.  Diagnosis: Major Depression  Past Medical History:  Past Medical History:  Diagnosis Date  . IgA nephropathy   . IgA nephropathy   . IgA nephropathy 2000    Past Surgical History:  Procedure Laterality Date  . BACK SURGERY      Family History: History reviewed. No pertinent family history.  Social History:  reports that he has been smoking. He has never used smokeless tobacco. He reports current alcohol use. He reports previous drug use. Drugs: Cocaine and Methamphetamines.  Additional Social History:  Alcohol / Drug Use Pain Medications: See PTA Prescriptions: See PTA Over the Counter: See PTA History of alcohol / drug use?: No history of alcohol / drug abuse Longest period of sobriety (when/how long): n/a  CIWA: CIWA-Ar BP: (!) 151/102 Pulse Rate: (!) 112 COWS:    Allergies: No Known Allergies  Home Medications: (Not in a hospital admission)   OB/GYN Status:  No LMP for male  patient.  General Assessment Data Location of Assessment: [facility] ED TTS Assessment: In system Is this a Tele or Face-to-Face Assessment?: Face-to-Face Is this an Initial Assessment or a Re-assessment for this encounter?: Initial Assessment Patient Accompanied by:: N/A Language Other than English: No Living Arrangements: Other (Comment) What gender do you identify as?: Male Date Telepsych consult ordered in [facility]: 09/29/20 Time Telepsych consult ordered in [facility]: 1534 Marital status: Single Pregnancy Status: No Living Arrangements: Alone Can pt return to current living arrangement?: Yes Admission Status: Voluntary Is patient capable of signing voluntary admission?: Yes Referral Source: Self/Family/Friend Insurance type: None  Medical Screening Exam [facility] Walk-in ONLY) Medical Exam completed: Yes  Crisis Care Plan Living Arrangements: Alone Legal Guardian: Other: (Self) Name of Psychiatrist: Reports of none Name of Therapist: Reports of none  Education Status Is patient currently in school?: No Is the patient employed, unemployed or receiving disability?: Unemployed  Risk to self with the past 6 months Suicidal Ideation: Yes-Currently Present Has patient been a risk to self within the past 6 months prior to admission? : Yes Suicidal Intent: No Has patient had any suicidal intent within the past 6 months prior to admission? : No Is patient at risk for suicide?: Yes Suicidal Plan?: Yes-Currently Present Has patient had any suicidal plan within the past 6 months prior to admission? : Yes Specify Current Suicidal Plan: Jump off a bridge Access to Means: Yes Specify Access to Suicidal Means: Local Bridges What has been your use of drugs/alcohol within the last 12 months?: Alcohol Previous  Attempts/Gestures: No How many times?: 0 Other Self Harm Risks: Reports of none Triggers for Past Attempts: None known Intentional Self Injurious Behavior: None Family Suicide History:  Unknown Recent stressful life event(s): Conflict (Comment), Loss (Comment), Other (Comment) Persecutory voices/beliefs?: No Depression: Yes Depression Symptoms: Tearfulness, Isolating, Loss of interest in usual pleasures, Feeling worthless/self pity Substance abuse history and/or treatment for substance abuse?: Yes Suicide prevention information given to non-admitted patients: Not applicable  Risk to Others within the past 6 months Homicidal Ideation: No Does patient have any lifetime risk of violence toward others beyond the six months prior to admission? : No Thoughts of Harm to Others: No Current Homicidal Intent: No Current Homicidal Plan: No Access to Homicidal Means: No Identified Victim: Reports of none History of harm to others?: No Assessment of Violence: None Noted Violent Behavior Description: Reports of none Does patient have access to weapons?: No Criminal Charges Pending?: No Does patient have a court date: No Is patient on probation?: No  Psychosis Hallucinations: None noted Delusions: None noted  Mental Status Report Appearance/Hygiene: Unremarkable, In scrubs Eye Contact: Fair Motor Activity: Freedom of movement, Unremarkable Speech: Logical/coherent, Unremarkable Level of Consciousness: Alert Mood: Anxious, Depressed, Sad, Pleasant Affect: Appropriate to circumstance, Depressed, Silly Anxiety Level: Minimal Thought Processes: Coherent, Relevant Judgement: Unimpaired Orientation: Person, Place, Time, Situation, Appropriate for developmental age Obsessive Compulsive Thoughts/Behaviors: None  Cognitive Functioning Concentration: Normal Memory: Recent Intact, Remote Intact Is patient IDD: No Insight: Fair Impulse Control: Fair Appetite: Fair Have you had any weight changes? : No Change Sleep: No Change Total Hours of Sleep: 8 Vegetative Symptoms: None  ADLScreening [facility] Assessment Services) Patient's cognitive ability adequate to safely complete  daily activities?: Yes Patient able to express need for assistance with ADLs?: Yes Independently performs ADLs?: Yes (appropriate for developmental age)  Prior Inpatient Therapy Prior Inpatient Therapy: Yes Prior Therapy Dates: Multiple hospitalizations Prior Therapy Facilty/Provider(s): Multiple hospitalizations Reason for Treatment: Mood Disorder  Prior Outpatient Therapy Prior Outpatient Therapy: No Does patient have an ACCT team?: No Does patient have Intensive In-House Services?  : No Does patient have [facility] services? : No Does patient have P4CC services?: No  ADL Screening (condition at time of admission) Patient's cognitive ability adequate to safely complete daily activities?: Yes Is the patient deaf or have difficulty hearing?: No Does the patient have difficulty seeing, even when wearing glasses/contacts?: No Does the patient have difficulty concentrating, remembering, or making decisions?: No Patient able to express need for assistance with ADLs?: Yes Does the patient have difficulty dressing or bathing?: No Independently performs ADLs?: Yes (appropriate for developmental age) Does the patient have difficulty walking or climbing stairs?: No Weakness of Legs: None Weakness of Arms/Hands: None  Home Assistive Devices/Equipment Home Assistive Devices/Equipment: None  Therapy Consults (therapy consults require a physician order) PT Evaluation Needed: No OT Evalulation Needed: No SLP Evaluation Needed: No Abuse/Neglect Assessment (Assessment to be complete while patient is alone) Abuse/Neglect Assessment Can Be Completed: Yes Physical Abuse: Denies Verbal Abuse: Denies Sexual Abuse: Denies Exploitation of patient/patient's resources: Denies Self-Neglect: Denies Values / Beliefs Cultural Requests During Hospitalization: None Spiritual Requests During Hospitalization: None Consults Spiritual Care Consult Needed: No Transition of Care Team Consult Needed:  No Advance Directives (For Healthcare) Does Patient Have a Medical Advance Directive?: No Would patient like information on creating a medical advance directive?: No - Patient declined  Disposition:  Disposition Initial Assessment Completed for this Encounter: Yes  On Site Evaluation by:   Reviewed with Physician:  Lilyan Gilford MS, LCAS, [facility], [facility] Therapeutic Triage Specialist 09/29/2020 4:43 PM

## 2020-09-29 NOTE — ED Triage Notes (Addendum)
Pt arrived to ed with [facility] for a psychiatric evaluation. Pt reports having SI thoughts x 2-3 weeks. Pt reports putting gun to head a couple weeks ago but brother in law got the gun away from patient.  Denies HI. [place] pt reports pt was seen at treatment center but was referred to ED. Pt currently having thoughts of jumping off bridge, or stepping out into traffic. Pt reports taking 50mg  Seroquel nightly. Pt calm and cooperative in triage.  Pt currently not under IVC

## 2020-09-30 NOTE — ED Provider Notes (Signed)
Emergency Medicine Observation Re-evaluation Note  Tyrone Wood is a 49 y.o. male, seen on rounds today.  Pt initially presented to the ED for complaints of Psychiatric Evaluation Currently, the patient is resting comfortably.  Physical Exam  BP 134/76   Pulse 97   Temp 97.7 F (36.5 C)   Resp 18   Ht 6' (1.829 m)   Wt 99.8 kg   SpO2 97%   BMI 29.84 kg/m  Physical Exam Constitutional:      Appearance: He is not ill-appearing or toxic-appearing.  Eyes:     Extraocular Movements: Extraocular movements intact.     Pupils: Pupils are equal, round, and reactive to light.  Cardiovascular:     Rate and Rhythm: Normal rate.  Pulmonary:     Effort: Pulmonary effort is normal.  Abdominal:     General: There is no distension.  Skin:    General: Skin is warm and dry.  Neurological:     General: No focal deficit present.     Cranial Nerves: No cranial nerve deficit.      ED Course / MDM  EKG:    I have reviewed the labs performed to date as well as medications administered while in observation.  Recent changes in the last 24 hours include continued bed search.  Plan  Current plan is for inpatient psychiatric care. Patient is not under full IVC at this time.   Delton Prairie, MD 09/30/20 [phone]

## 2020-09-30 NOTE — ED Notes (Signed)
Hourly rounding reveals patient in room. No complaints, stable, in no acute distress. Q15 minute rounds and monitoring via [employer] to continue. 

## 2020-09-30 NOTE — BH Assessment (Signed)
Patient has been accepted to [facility].  Patient assigned to [facility] Accepting physician is Dr. Faith Rogue.  Call report to [phone].  Representative was [place] .   ER Staff is aware of it:  Misty Stanley, ER Secretary  Dr. Adaline Sill, ER MD  Amy T., Patient's Nurse  Address: [address],  [place], [place] [address]  Patient bed is available after 12 noon.

## 2020-09-30 NOTE — ED Notes (Addendum)
No vitals taken, pt asleep

## 2020-09-30 NOTE — ED Notes (Signed)
SAFE  TRANSPORT  CALLED  FOR  TRANSPORT  TO  OLD  [facility]

## 2020-09-30 NOTE — BH Assessment (Addendum)
   [facility] [phone]), No answer follow up needed.     [facility] Per charge nurse Matt there are no appropriate beds at this time. Follow up needed.    Referral information for Psychiatric Hospitalization faxed to;   [facility] ([phone]- [phone]), Per Jonny Ruiz pt denied due to no appropriate beds.   [facility] [phone]---[phone]),Heather at after hours access center requested a re-fax. Task completed at 6:02 AM.   [facility] [phone], [phone], [phone] or [phone]), Per Amy, there is no intake staff available at this time. Advised to call back around 8:30 AM.     [facility] [phone] or [phone]) Left message   [facility] [phone]), Per [facility] pt declined due to funding.    [facility] [phone] -or- [phone]), Durenda Age requested a refax. Task completed at 6:27AM.    [facility] [phone]). Left a message.

## 2020-09-30 NOTE — ED Notes (Signed)
Unable to obtain VS  He is sleeping

## 2021-02-14 IMAGING — DX DG CHEST 1V PORT
1 series · 1 of 1 positions shown · non-contrast
Comparison: None.

CLINICAL DATA: Chest pain

EXAM:
PORTABLE CHEST 1 VIEW

[chest ap]
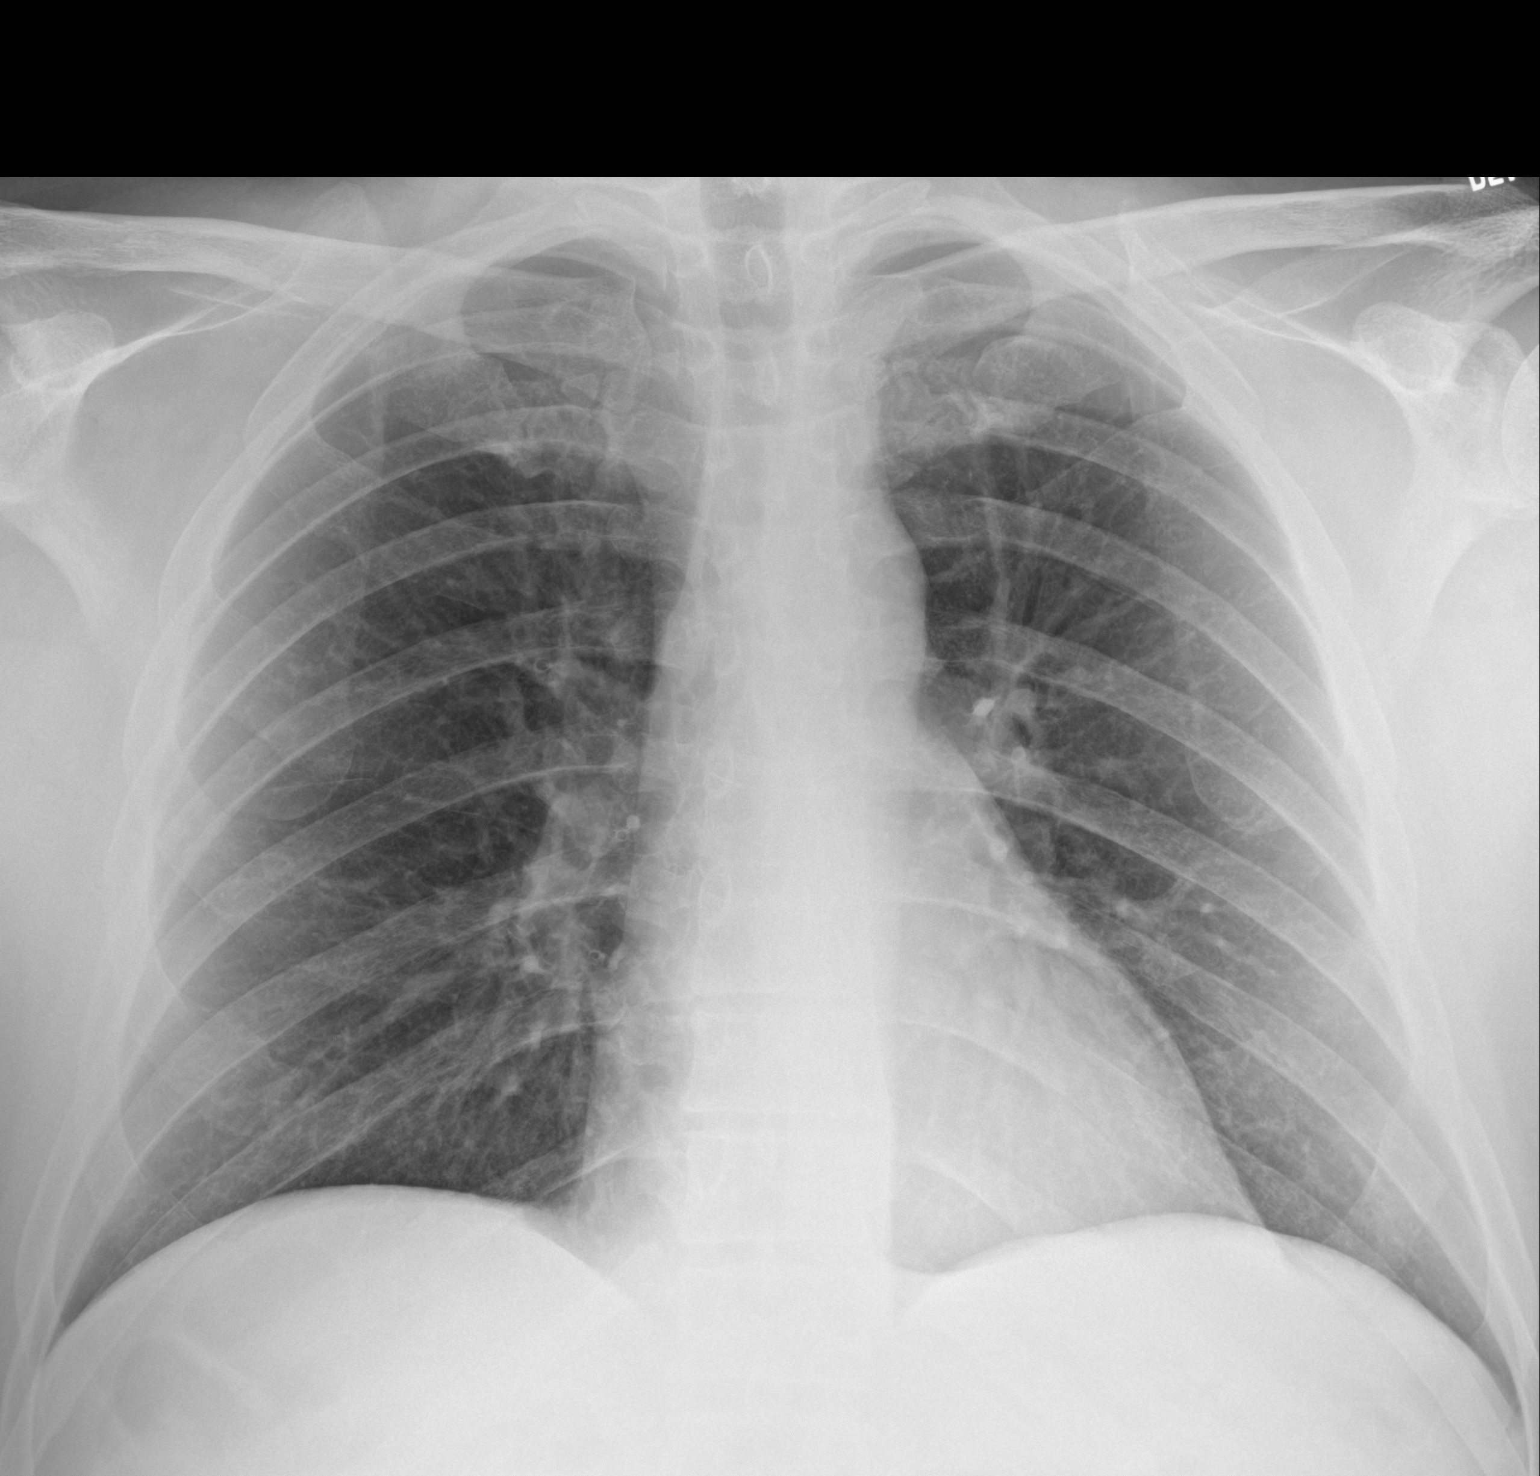

[1 of 1 positions shown; findings below may reference images not displayed]

FINDINGS: The heart size and mediastinal contours are within normal limits.
Both lungs are clear. The visualized skeletal structures are
unremarkable.
IMPRESSION: No active disease.

## 2021-11-19 DEATH — deceased
